# Patient Record
Sex: Female | Born: 1980 | Race: Black or African American | Hispanic: No | Marital: Single | Smoking: Never smoker
Health system: Southern US, Community
[De-identification: ages and names within clinical notes are randomized; demographics above are authoritative.]

## PROBLEM LIST (undated history)

## (undated) DIAGNOSIS — E119 Type 2 diabetes mellitus without complications: Secondary | ICD-10-CM

## (undated) DIAGNOSIS — E669 Obesity, unspecified: Secondary | ICD-10-CM

## (undated) DIAGNOSIS — I10 Essential (primary) hypertension: Secondary | ICD-10-CM

## (undated) DIAGNOSIS — E1140 Type 2 diabetes mellitus with diabetic neuropathy, unspecified: Secondary | ICD-10-CM

## (undated) HISTORY — PX: HYSTEROSCOPY: SHX211

---

## 2009-04-19 ENCOUNTER — Emergency Department (HOSPITAL_COMMUNITY): Admission: EM | Admit: 2009-04-19 | Discharge: 2009-04-19 | Payer: Self-pay | Admitting: Emergency Medicine

## 2010-10-31 LAB — URINALYSIS, ROUTINE W REFLEX MICROSCOPIC
Nitrite: NEGATIVE
Specific Gravity, Urine: 1.017 (ref 1.005–1.030)
pH: 5.5 (ref 5.0–8.0)

## 2010-10-31 LAB — GLUCOSE, CAPILLARY

## 2010-10-31 LAB — POCT PREGNANCY, URINE: Preg Test, Ur: NEGATIVE

## 2015-05-03 ENCOUNTER — Emergency Department (HOSPITAL_COMMUNITY): Payer: Self-pay

## 2015-05-03 ENCOUNTER — Emergency Department (HOSPITAL_COMMUNITY)
Admission: EM | Admit: 2015-05-03 | Discharge: 2015-05-03 | Disposition: A | Discharge status: Home / Self Care | Payer: Self-pay | Attending: Emergency Medicine | Admitting: Emergency Medicine

## 2015-05-03 ENCOUNTER — Encounter (HOSPITAL_COMMUNITY): Payer: Self-pay | Admitting: Emergency Medicine

## 2015-05-03 DIAGNOSIS — A599 Trichomoniasis, unspecified: Secondary | ICD-10-CM | POA: Insufficient documentation

## 2015-05-03 DIAGNOSIS — I10 Essential (primary) hypertension: Secondary | ICD-10-CM | POA: Insufficient documentation

## 2015-05-03 DIAGNOSIS — Z3202 Encounter for pregnancy test, result negative: Secondary | ICD-10-CM | POA: Insufficient documentation

## 2015-05-03 DIAGNOSIS — E669 Obesity, unspecified: Secondary | ICD-10-CM | POA: Insufficient documentation

## 2015-05-03 DIAGNOSIS — R0789 Other chest pain: Secondary | ICD-10-CM | POA: Insufficient documentation

## 2015-05-03 DIAGNOSIS — Z880 Allergy status to penicillin: Secondary | ICD-10-CM | POA: Insufficient documentation

## 2015-05-03 DIAGNOSIS — E1140 Type 2 diabetes mellitus with diabetic neuropathy, unspecified: Secondary | ICD-10-CM | POA: Insufficient documentation

## 2015-05-03 HISTORY — DX: Type 2 diabetes mellitus with diabetic neuropathy, unspecified: E11.40

## 2015-05-03 HISTORY — DX: Obesity, unspecified: E66.9

## 2015-05-03 HISTORY — DX: Essential (primary) hypertension: I10

## 2015-05-03 HISTORY — DX: Type 2 diabetes mellitus without complications: E11.9

## 2015-05-03 LAB — CBC
HEMATOCRIT: 39.9 % (ref 36.0–46.0)
HEMOGLOBIN: 12.3 g/dL (ref 12.0–15.0)
MCH: 25.1 pg — ABNORMAL LOW (ref 26.0–34.0)
MCHC: 30.8 g/dL (ref 30.0–36.0)
MCV: 81.3 fL (ref 78.0–100.0)
Platelets: 310 10*3/uL (ref 150–400)
RBC: 4.91 MIL/uL (ref 3.87–5.11)
RDW: 14.4 % (ref 11.5–15.5)
WBC: 9.8 10*3/uL (ref 4.0–10.5)

## 2015-05-03 LAB — I-STAT TROPONIN, ED
TROPONIN I, POC: 0 ng/mL (ref 0.00–0.08)
Troponin i, poc: 0.01 ng/mL (ref 0.00–0.08)

## 2015-05-03 LAB — BASIC METABOLIC PANEL
ANION GAP: 4 — AB (ref 5–15)
BUN: 8 mg/dL (ref 6–20)
CHLORIDE: 105 mmol/L (ref 101–111)
CO2: 29 mmol/L (ref 22–32)
Calcium: 9.5 mg/dL (ref 8.9–10.3)
Creatinine, Ser: 0.81 mg/dL (ref 0.44–1.00)
GFR calc Af Amer: >60 mL/min (ref >60)
GLUCOSE: 205 mg/dL — AB (ref 65–99)
POTASSIUM: 4.6 mmol/L (ref 3.5–5.1)
Sodium: 138 mmol/L (ref 135–145)

## 2015-05-03 LAB — URINALYSIS, ROUTINE W REFLEX MICROSCOPIC
Bilirubin Urine: NEGATIVE
Glucose, UA: NEGATIVE mg/dL
Ketones, ur: NEGATIVE mg/dL
Nitrite: NEGATIVE
PH: 5.5 (ref 5.0–8.0)
Protein, ur: NEGATIVE mg/dL
SPECIFIC GRAVITY, URINE: 1.028 (ref 1.005–1.030)
UROBILINOGEN UA: 0.2 mg/dL (ref 0.0–1.0)

## 2015-05-03 LAB — POC URINE PREG, ED: PREG TEST UR: NEGATIVE

## 2015-05-03 LAB — URINE MICROSCOPIC-ADD ON

## 2015-05-03 MED ORDER — METRONIDAZOLE 500 MG PO TABS
2000.0000 mg | ORAL_TABLET | Freq: Once | ORAL | Status: AC
  Administered 2015-05-03: 2000 mg via ORAL
  Filled 2015-05-03: qty 4

## 2015-05-03 NOTE — ED Notes (Signed)
Phlebotomy at the bedside  

## 2015-05-03 NOTE — ED Notes (Signed)
Dr. gaddy at the bedside.  

## 2015-05-03 NOTE — ED Provider Notes (Signed)
CSN: [id number]     Arrival date & time 05/03/15  1908 History   First MD Initiated Contact with Patient 05/03/15 1944     Chief Complaint  Patient presents with  . Chest Pain     (Consider location/radiation/quality/duration/timing/severity/associated sxs/prior Treatment) Patient is a 34 y.o. female presenting with chest pain.  Chest Pain Pain location:  L chest Pain quality: aching, sharp and shooting   Pain radiates to:  L arm Pain radiates to the back: no   Pain severity:  Moderate Onset quality:  Sudden Duration:  1 hour Timing:  Intermittent Progression:  Unchanged Chronicity:  New Context: movement and raising an arm   Context: no drug use, not eating, no intercourse, not at rest, no stress and no trauma   Context comment:  Palpation Relieved by:  Certain positions Associated symptoms: no abdominal pain, no AICD problem, no altered mental status, no anorexia, no anxiety, no claudication, no cough, no diaphoresis, no dysphagia, no fever, no headache, no nausea, no near-syncope, no numbness, no shortness of breath, no syncope, not vomiting and no weakness   Risk factors: diabetes mellitus, hypertension and obesity   Risk factors: no coronary artery disease, no high cholesterol, not pregnant, no prior DVT/PE and no smoking     Past Medical History  Diagnosis Date  . Diabetes mellitus without complication (HCC)   . Hypertension   . Obesity   . Diabetic neuropathy [facility])    Past Surgical History  Procedure Laterality Date  . Hysteroscopy     No family history on file. Social History  Substance Use Topics  . Smoking status: Never Smoker   . Smokeless tobacco: None  . Alcohol Use: No   OB History    No data available     Review of Systems  Constitutional: Negative for fever and diaphoresis.  HENT: Negative for trouble swallowing.   Respiratory: Negative for cough and shortness of breath.   Cardiovascular: Positive for chest pain. Negative for claudication,  syncope and near-syncope.  Gastrointestinal: Negative for nausea, vomiting, abdominal pain and anorexia.  Neurological: Negative for weakness, numbness and headaches.  All other systems reviewed and are negative.     Allergies  Amoxicillin and Zithromax  Home Medications   Prior to Admission medications   Not on File   BP 145/94 mmHg  Pulse 66  Temp(Src) 98 F (36.7 C) (Oral)  Resp 16  SpO2 100%  LMP  (LMP Unknown) Physical Exam  Constitutional: She is oriented to person, place, and time. She appears well-developed and well-nourished.  Obese young lady  HENT:  Head: Normocephalic and atraumatic.  Eyes: Pupils are equal, round, and reactive to light.  Neck: Normal range of motion.  Cardiovascular: Normal rate.   No murmur heard. Pulmonary/Chest: Effort normal and breath sounds normal. No respiratory distress. She has no wheezes. She has no rales.  Point tenderness to left chest wall worsened by palpation. Left arm pain also with point tenderness.   Abdominal: Soft. She exhibits no distension. There is no tenderness. There is no rebound.  Musculoskeletal: Normal range of motion. She exhibits no edema.  Neurological: She is alert and oriented to person, place, and time. No cranial nerve deficit. Coordination normal.  Skin: Skin is warm and dry. She is not diaphoretic. No erythema.  Psychiatric: She has a normal mood and affect. Her behavior is normal. Judgment and thought content normal.  Nursing note and vitals reviewed.   ED Course  Procedures (including critical care time) Labs  Review Labs Reviewed  BASIC METABOLIC PANEL - Abnormal; Notable for the following:    Glucose, Bld 205 (*)    Anion gap 4 (*)    All other components within normal limits  CBC - Abnormal; Notable for the following:    MCH 25.1 (*)    All other components within normal limits  URINALYSIS, ROUTINE W REFLEX MICROSCOPIC (NOT AT [facility])  POC URINE PREG, ED  Rosezena Sensor, ED    Imaging  Review Dg Chest 2 View  05/03/2015   CLINICAL DATA:  Left-sided chest pain and left upper extremity paresthesias. Onset 2 hours ago.  EXAM: CHEST  2 VIEW  COMPARISON:  None.  FINDINGS: The lungs are clear except for minimal linear left base scarring or atelectasis. There is no pleural effusion. Pulmonary vasculature is normal. Hilar and mediastinal contours are unremarkable. Heart size is normal.  IMPRESSION: No active cardiopulmonary disease.   Electronically Signed   By: Ellery Plunk M.D.   On: 05/03/2015 20:18   I have personally reviewed and evaluated these images and lab results as part of my medical decision-making.    MDM   Patient presents emergency department today with left-sided chest pain does pinpoint exacerbated by palpation and movement. Patient denies any shortness breath denies any nausea, vomiting, diaphoresis. She has a significant past medical history of hypertension diabetes morbid obesity. EKG shows NSR without ischemia.  Patient has low well's and negative PERC.  Given patient's risk factors of morbid obesity will obtain delta troponins due to low heart score. Patient states improvement in chest pain and arm pain. Patient found to have trichomonas in urine and treated according. She denies any other vaginal discharge, bleeding and does not have additional concerns for STD at this time. Patient will have follow up with PCP.     Final diagnoses:  Chest wall pain  Trichomonas infection        Deirdre Peer, MD 05/04/15 1610  Blane Ohara, MD 05/04/15 1505

## 2015-05-03 NOTE — Discharge Instructions (Signed)
Chest Wall Pain Chest wall pain is pain in or around the bones and muscles of your chest. Sometimes, an injury causes this pain. Sometimes, the cause may not be known. This pain may take several weeks or longer to get better. HOME CARE INSTRUCTIONS  Pay attention to any changes in your symptoms. Take these actions to help with your pain:   Rest as told by your health care provider.   Avoid activities that cause pain. These include any activities that use your chest muscles or your abdominal and side muscles to lift heavy items.   If directed, apply ice to the painful area:  Put ice in a plastic bag.  Place a towel between your skin and the bag.  Leave the ice on for 20 minutes, 2-3 times per day.  Take over-the-counter and prescription medicines only as told by your health care provider.  Do not use tobacco products, including cigarettes, chewing tobacco, and e-cigarettes. If you need help quitting, ask your health care provider.  Keep all follow-up visits as told by your health care provider. This is important. SEEK MEDICAL CARE IF:  You have a fever.  Your chest pain becomes worse.  You have new symptoms. SEEK IMMEDIATE MEDICAL CARE IF:  You have nausea or vomiting.  You feel sweaty or light-headed.  You have a cough with phlegm (sputum) or you cough up blood.  You develop shortness of breath.   This information is not intended to replace advice given to you by your health care provider. Make sure you discuss any questions you have with your health care provider.   Document Released: 07/13/2005 Document Revised: 04/03/2015 Document Reviewed: 10/08/2014 Elsevier Interactive Patient Education 2016 [employer].  Trichomoniasis Trichomoniasis is an infection caused by an organism called Trichomonas. The infection can affect both women and men. In women, the outer female genitalia and the vagina are affected. In men, the penis is mainly affected, but the prostate and  other reproductive organs can also be involved. Trichomoniasis is a sexually transmitted infection (STI) and is most often passed to another person through sexual contact.  RISK FACTORS  Having unprotected sexual intercourse.  Having sexual intercourse with an infected partner. SIGNS AND SYMPTOMS  Symptoms of trichomoniasis in women include:  Abnormal gray-green frothy vaginal discharge.  Itching and irritation of the vagina.  Itching and irritation of the area outside the vagina. Symptoms of trichomoniasis in men include:   Penile discharge with or without pain.  Pain during urination. This results from inflammation of the urethra. DIAGNOSIS  Trichomoniasis may be found during a Pap test or physical exam. Your health care provider may use one of the following methods to help diagnose this infection:  Testing the pH of the vagina with a test tape.  Using a vaginal swab test that checks for the Trichomonas organism. A test is available that provides results within a few minutes.  Examining a urine sample.  Testing vaginal secretions. Your health care provider may test you for other STIs, including HIV. TREATMENT   You may be given medicine to fight the infection. Women should inform their health care provider if they could be or are pregnant. Some medicines used to treat the infection should not be taken during pregnancy.  Your health care provider may recommend over-the-counter medicines or creams to decrease itching or irritation.  Your sexual partner will need to be treated if infected.  Your health care provider may test you for infection again 3 months after treatment.  HOME CARE INSTRUCTIONS   Take medicines only as directed by your health care provider.  Take over-the-counter medicine for itching or irritation as directed by your health care provider.  Do not have sexual intercourse while you have the infection.  Women should not douche or wear tampons while they  have the infection.  Discuss your infection with your partner. Your partner may have gotten the infection from you, or you may have gotten it from your partner.  Have your sex partner get examined and treated if necessary.  Practice safe, informed, and protected sex.  See your health care provider for other STI testing. SEEK MEDICAL CARE IF:   You still have symptoms after you finish your medicine.  You develop abdominal pain.  You have pain when you urinate.  You have bleeding after sexual intercourse.  You develop a rash.  Your medicine makes you sick or makes you throw up (vomit). MAKE SURE YOU:  Understand these instructions.  Will watch your condition.  Will get help right away if you are not doing well or get worse.   This information is not intended to replace advice given to you by your health care provider. Make sure you discuss any questions you have with your health care provider.   Document Released: 01/06/2001 Document Revised: 08/03/2014 Document Reviewed: 04/24/2013 Elsevier Interactive Patient Education [employer]

## 2015-05-03 NOTE — ED Notes (Signed)
Pt. reports left chest pain onset this evening with left arm numbness, headache and low abdominal pain , denies SOB , no nausea or diaphoresis.

## 2017-04-07 IMAGING — DX DG CHEST 2V
2 series · 2 of 2 positions shown · non-contrast
Comparison: None.

CLINICAL DATA: Left-sided chest pain and left upper extremity
paresthesias. Onset 2 hours ago.

EXAM:
CHEST  2 VIEW

[chest pa]
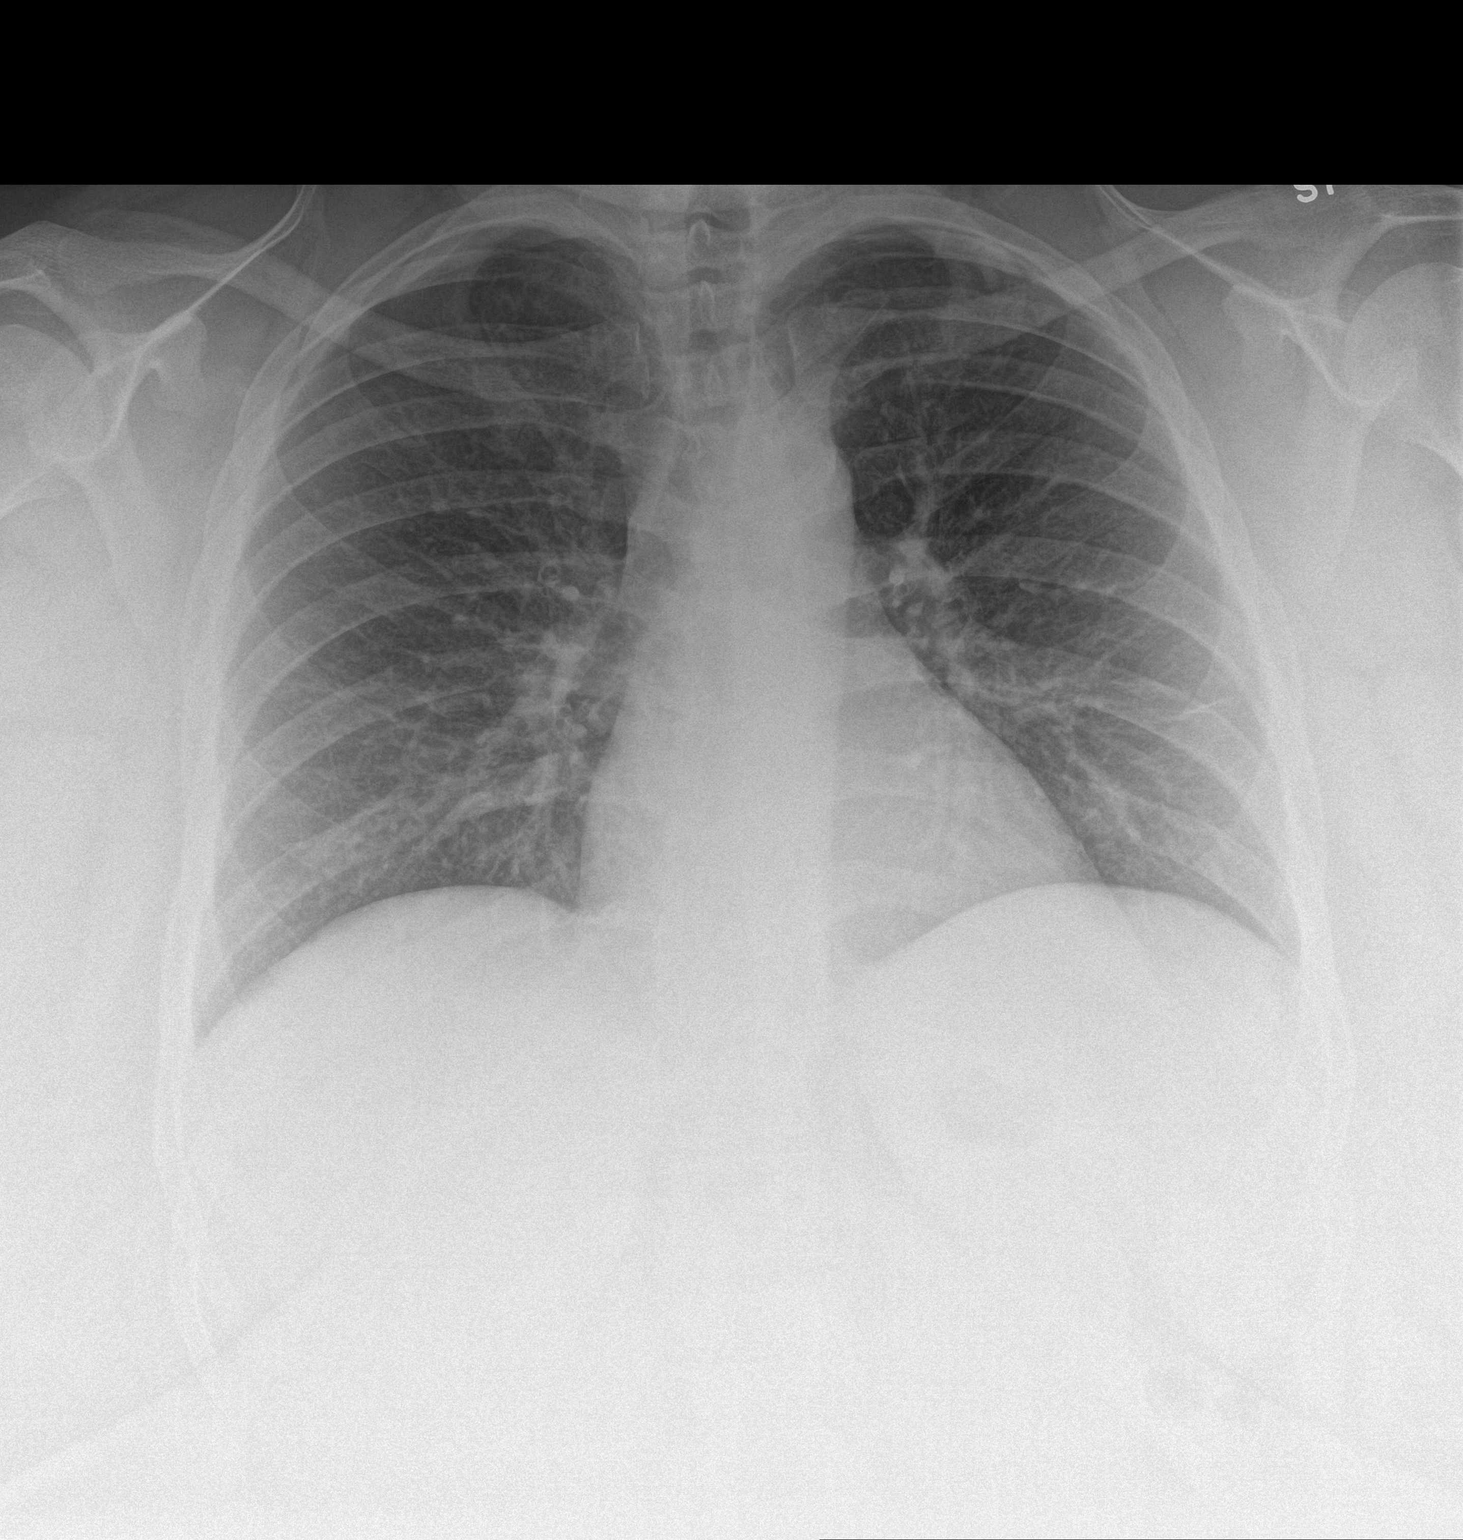

[chest lat]
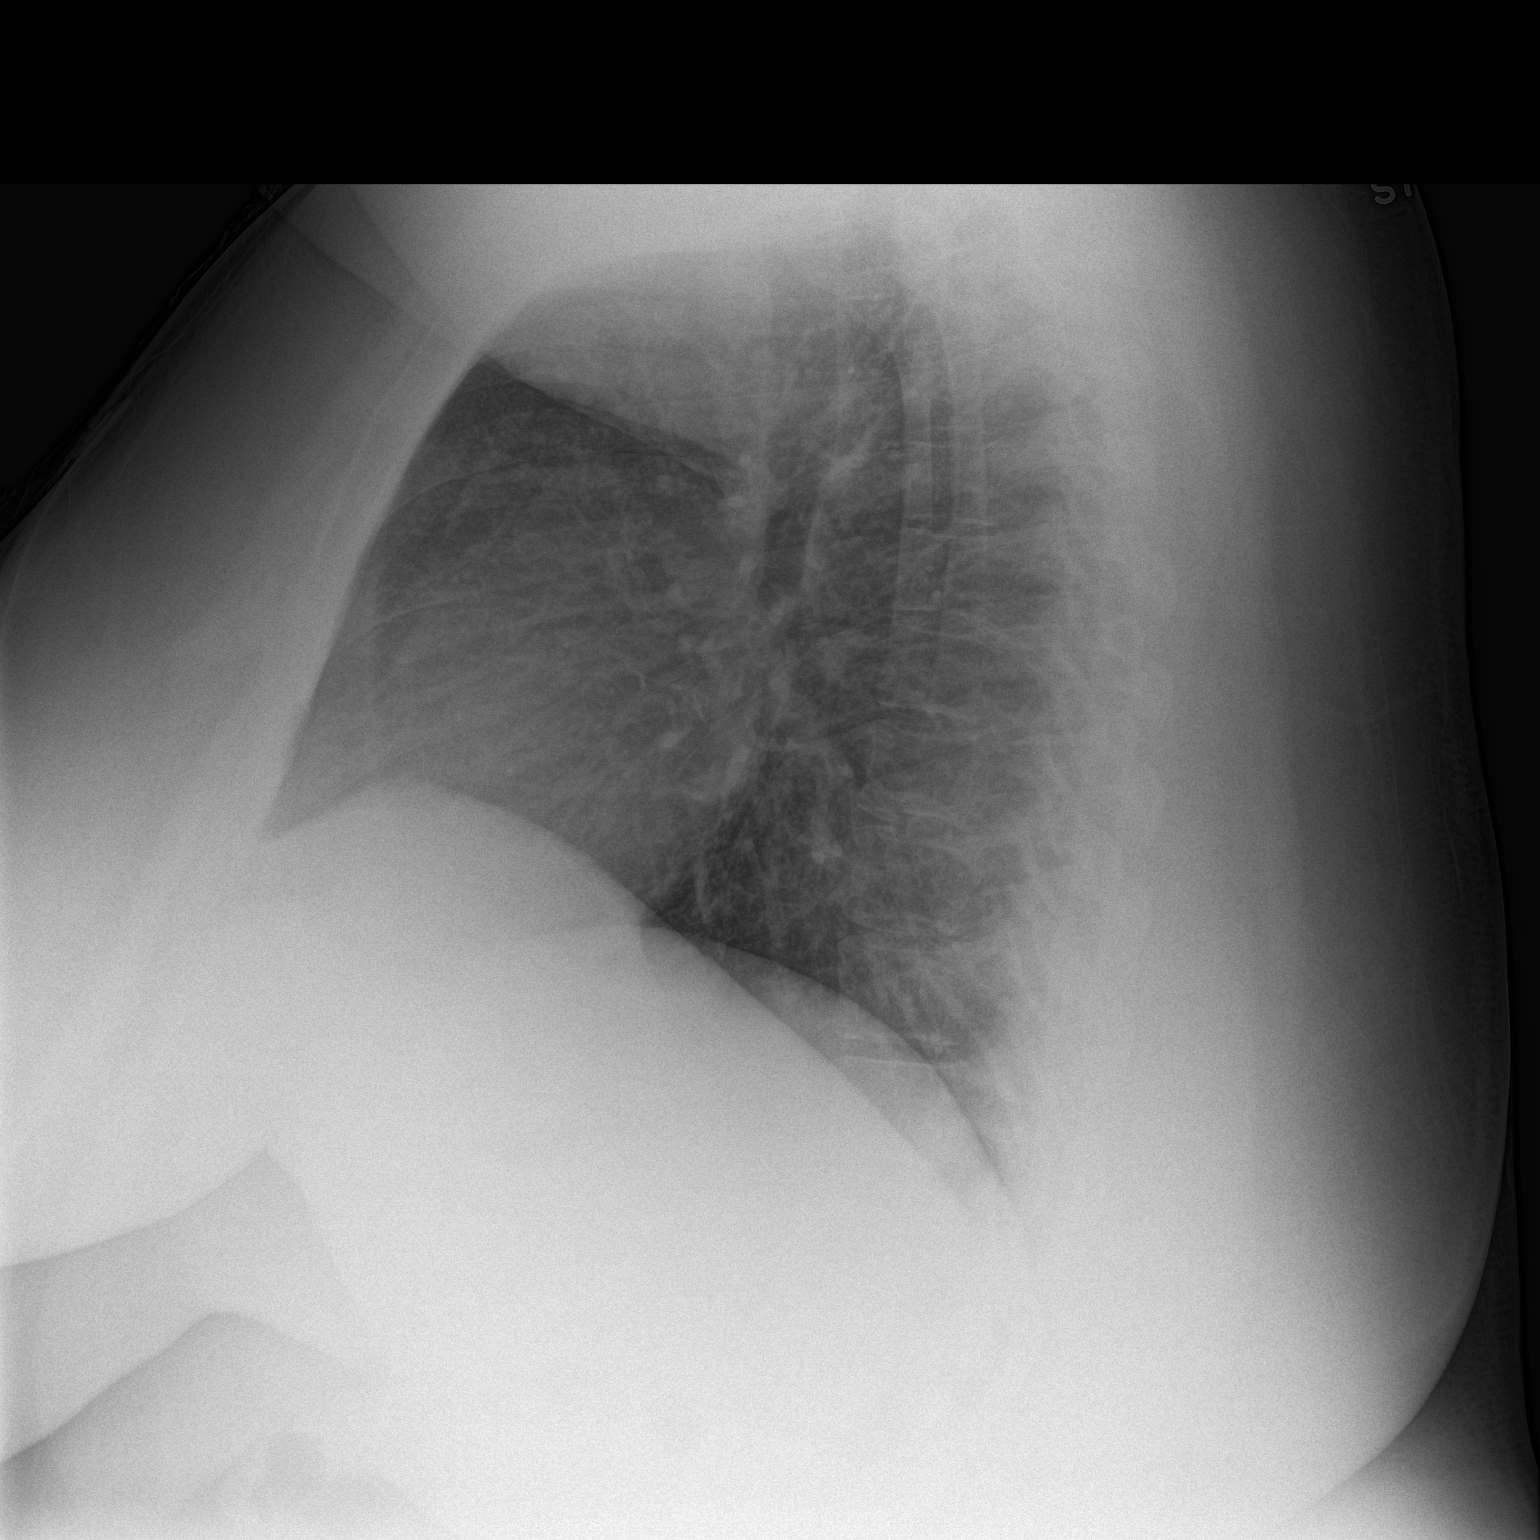

[2 of 2 positions shown; findings below may reference images not displayed]

FINDINGS: The lungs are clear except for minimal linear left base scarring or
atelectasis. There is no pleural effusion. Pulmonary vasculature is
normal. Hilar and mediastinal contours are unremarkable. Heart size
is normal.
IMPRESSION: No active cardiopulmonary disease.
